# Patient Record
Sex: Female | Born: 2005 | Race: Black or African American | Hispanic: No | Marital: Single | Smoking: Never smoker
Health system: Southern US, Community
[De-identification: ages and names within clinical notes are randomized; demographics above are authoritative.]

## PROBLEM LIST (undated history)

## (undated) DIAGNOSIS — N390 Urinary tract infection, site not specified: Secondary | ICD-10-CM

## (undated) DIAGNOSIS — H1010 Acute atopic conjunctivitis, unspecified eye: Secondary | ICD-10-CM

## (undated) DIAGNOSIS — J309 Allergic rhinitis, unspecified: Principal | ICD-10-CM

## (undated) DIAGNOSIS — L309 Dermatitis, unspecified: Secondary | ICD-10-CM

## (undated) HISTORY — DX: Acute atopic conjunctivitis, unspecified eye: H10.10

## (undated) HISTORY — PX: TONSILLECTOMY: SUR1361

## (undated) HISTORY — PX: NO PAST SURGERIES: SHX2092

## (undated) HISTORY — DX: Acute atopic conjunctivitis, unspecified eye: J30.9

## (undated) HISTORY — PX: TENDON REPAIR: SHX5111

## (undated) HISTORY — PX: WISDOM TOOTH EXTRACTION: SHX21

## (undated) HISTORY — DX: Dermatitis, unspecified: L30.9

---
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
## 2025-01-16 ENCOUNTER — Ambulatory Visit: Admitting: Internal Medicine
